# Patient Record
Sex: Female | Born: 1975 | Race: White | Hispanic: Yes | Marital: Single | Smoking: Never smoker
Health system: Southern US, Community
[De-identification: ages and names within clinical notes are randomized; demographics above are authoritative.]

## PROBLEM LIST (undated history)

## (undated) DIAGNOSIS — B159 Hepatitis A without hepatic coma: Secondary | ICD-10-CM

## (undated) HISTORY — DX: Hepatitis a without hepatic coma: B15.9

---
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
## 2005-07-12 ENCOUNTER — Inpatient Hospital Stay (HOSPITAL_COMMUNITY): Admission: AD | Admit: 2005-07-12 | Discharge: 2005-07-12 | Payer: Self-pay | Admitting: *Deleted

## 2005-11-27 ENCOUNTER — Inpatient Hospital Stay (HOSPITAL_COMMUNITY): Admission: AD | Admit: 2005-11-27 | Discharge: 2005-11-27 | Payer: Self-pay | Admitting: Obstetrics

## 2005-12-08 ENCOUNTER — Inpatient Hospital Stay (HOSPITAL_COMMUNITY): Admission: RE | Admit: 2005-12-08 | Discharge: 2005-12-11 | Payer: Self-pay | Admitting: Obstetrics

## 2007-04-01 ENCOUNTER — Emergency Department (HOSPITAL_COMMUNITY): Admission: EM | Admit: 2007-04-01 | Discharge: 2007-04-01 | Payer: Self-pay | Admitting: Emergency Medicine

## 2007-07-03 ENCOUNTER — Encounter: Payer: Self-pay | Admitting: Family Medicine
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
## 2009-02-17 ENCOUNTER — Ambulatory Visit: Payer: Self-pay | Admitting: Family Medicine

## 2009-02-17 DIAGNOSIS — N946 Dysmenorrhea, unspecified: Secondary | ICD-10-CM

## 2009-02-17 DIAGNOSIS — N649 Disorder of breast, unspecified: Secondary | ICD-10-CM | POA: Insufficient documentation

## 2009-02-17 DIAGNOSIS — K219 Gastro-esophageal reflux disease without esophagitis: Secondary | ICD-10-CM

## 2009-03-31 ENCOUNTER — Ambulatory Visit: Payer: Self-pay | Admitting: Family Medicine

## 2009-03-31 DIAGNOSIS — R300 Dysuria: Secondary | ICD-10-CM

## 2009-03-31 LAB — CONVERTED CEMR LAB
Bilirubin Urine: NEGATIVE
Protein, U semiquant: NEGATIVE
Specific Gravity, Urine: >=1.03
Urobilinogen, UA: 0.2
pH: 5.5

## 2009-04-01 ENCOUNTER — Encounter: Payer: Self-pay | Admitting: Family Medicine

## 2009-04-04 ENCOUNTER — Encounter: Payer: Self-pay | Admitting: Family Medicine

## 2010-05-31 ENCOUNTER — Encounter: Payer: Self-pay | Admitting: *Deleted

## 2010-09-11 NOTE — Discharge Summary (Signed)
NAME:  Linda Murphy, CLENDENEN NO.:  [id number]   MEDICAL RECORD NO.:  [id number]          PATIENT TYPE:  INP   LOCATION:  9133                          FACILITY:  [facility]   PHYSICIAN:  Kathreen Cosier, M.D.DATE OF BIRTH:  11/14/1975   DATE OF ADMISSION:  12/08/2005  DATE OF DISCHARGE:  12/11/2005                                 DISCHARGE SUMMARY   The patient is a 35 year old gravida 2, para 1-0-0-1 who had a previous  cesarean section and is now at term, desires repeat cesarean section, and on  August 15 had a female, Apgars 8 and 9, 6 pounds 15 ounces. Postoperatively,  she did well. Her hemoglobin was 11.5. RPR negative. Urine negative. She was  discharged home on the third postoperative day ambulatory, on a regular  diet, to see me in six weeks.   DISCHARGE DIAGNOSIS:  Status post elective repeat cesarean section a term.           ______________________________  Kathreen Cosier, M.D.     BAM/MEDQ  D:  01/05/2006  T:  01/05/2006  Job:  [id number]

## 2010-09-11 NOTE — Op Note (Signed)
NAME:  Linda Murphy, Linda Murphy NO.:  [id number]   MEDICAL RECORD NO.:  [id number]          PATIENT TYPE:  INP   LOCATION:  9133                          FACILITY:  [facility]   PHYSICIAN:  Kathreen Cosier, M.D.DATE OF BIRTH:  Sep 19, 1975   DATE OF PROCEDURE:  12/08/2005  DATE OF DISCHARGE:                                 OPERATIVE REPORT   PREOPERATIVE DIAGNOSIS:  Previous cesarean section at term, desires repeat.   POSTOPERATIVE DIAGNOSIS:  Previous cesarean section at term, desires repeat.   OPERATION PERFORMED:   SURGEON:  Kathreen Cosier, M.D.   ASSISTANT:   ANESTHESIA:  Spinal.   DESCRIPTION OF PROCEDURE:  Patient placed on the operating table in supine  position.  After the spinal was administered, the abdomen prepped and  draped, bladder emptied with Foley catheter.  Transverse suprapubic incision  was made through the old scar, carried down to the rectus fascia, fascia  cleanly incised the length of the incision.  Rectus muscles were retracted  laterally.  Peritoneum incised longitudinally.  Transverse incision made in  the visceral peritoneum above the bladder.  Bladder mobilized inferiorly.  Transverse lower uterine incision made.  The patient was delivered from the  LOA position of a female, Apgar 8 and 9, weighing 6 pounds and 15 ounces.  The fluid was clear.  Team was in attendance.  Placenta was posterior and  removed manually, sent to labor and delivery.  Uterine cavity cleaned with  dry laps.  Uterine incision closed  in one layer with continuous suture of  #1 chromic.  Hemostasis was satisfactory.  Bladder flap reattached with 2-0  chromic.  Lap and sponge counts correct.  Abdomen closed in layers.  Peritoneum continuous 2-0 chromic.  Fascia continuous suture of 0 Dexon and  the skin closed with subcuticular stitch of 4-0 Monocryl.   ESTIMATED BLOOD LOSS:  600 mL.           ______________________________  Kathreen Cosier, M.D.     BAM/MEDQ  D:  12/08/2005  T:  12/08/2005  Job:  [id number]

## 2011-01-04 ENCOUNTER — Other Ambulatory Visit: Payer: Self-pay | Admitting: Geriatric Medicine

## 2011-01-04 DIAGNOSIS — N83201 Unspecified ovarian cyst, right side: Secondary | ICD-10-CM

## 2011-01-07 ENCOUNTER — Ambulatory Visit
Admission: RE | Admit: 2011-01-07 | Discharge: 2011-01-07 | Disposition: A | Discharge status: Home / Self Care | Payer: Self-pay | Source: Ambulatory Visit | Attending: Geriatric Medicine | Admitting: Geriatric Medicine

## 2011-01-07 DIAGNOSIS — N83201 Unspecified ovarian cyst, right side: Secondary | ICD-10-CM

## 2011-04-22 ENCOUNTER — Encounter: Payer: Self-pay | Admitting: Family Medicine

## 2012-03-03 IMAGING — US US PELVIS COMPLETE
1 series · 14 of 25 positions shown · non-contrast
Comparison: None.

CLINICAL DATA: Lower pelvic pain, ovarian cyst on the right

TRANSABDOMINAL AND TRANSVAGINAL ULTRASOUND OF PELVIS
TECHNIQUE: Both transabdominal and transvaginal ultrasound
examinations of the pelvis were performed. Transabdominal technique
was performed for global imaging of the pelvis including uterus,
ovaries, adnexal regions, and pelvic cul-de-sac.

[Series 1: us pelvis complete · 0.26mm/px · 14 of 57 slices shown]
[im 1/57]
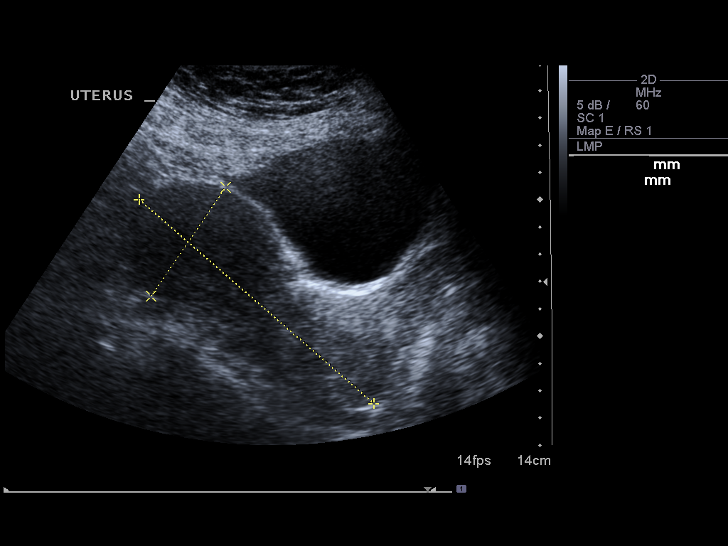
[im 5/57]
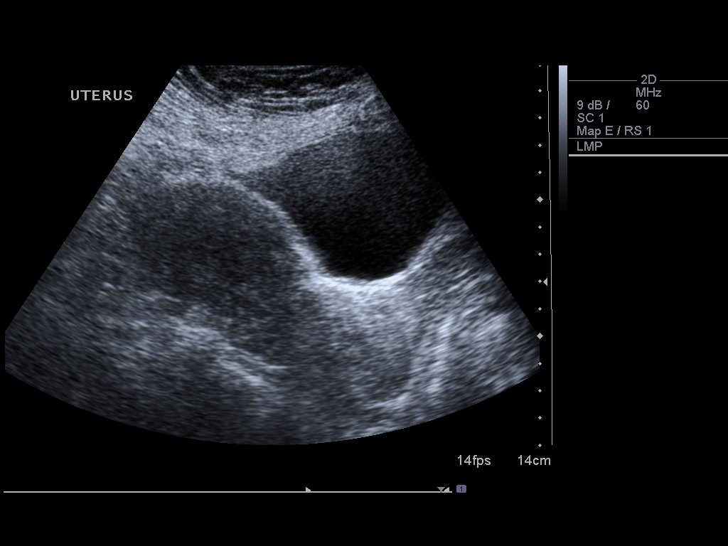
[im 10/57]
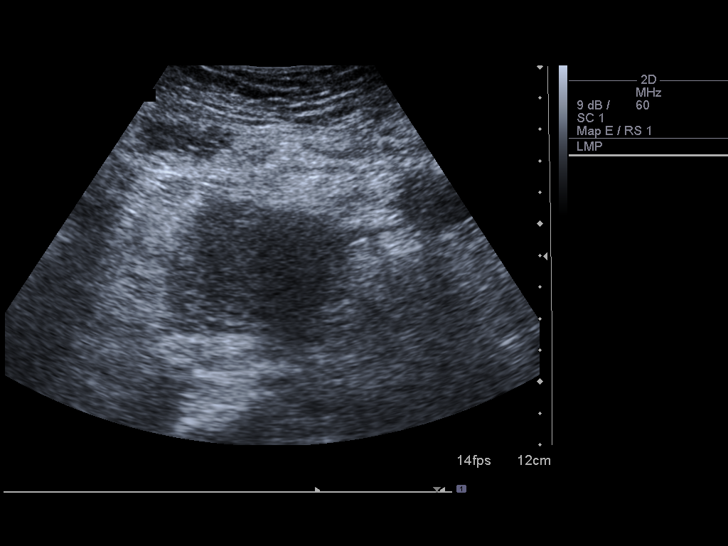
[im 15/57]
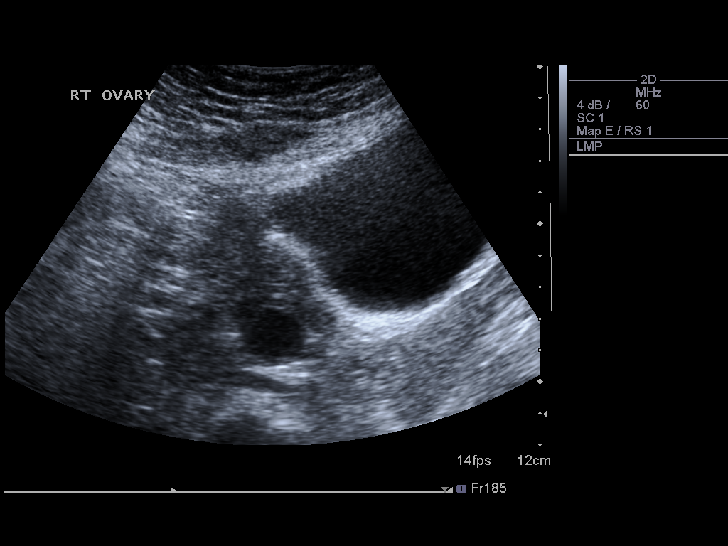
[im 19/57]
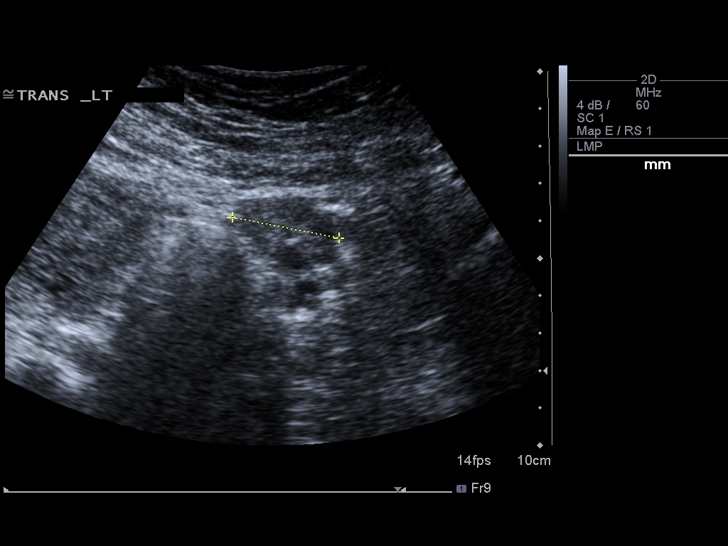
[im 22/57]
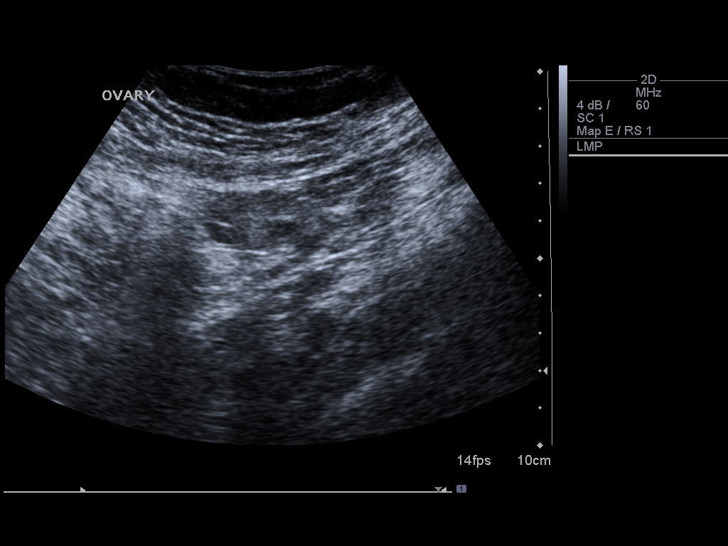
[im 26/57]
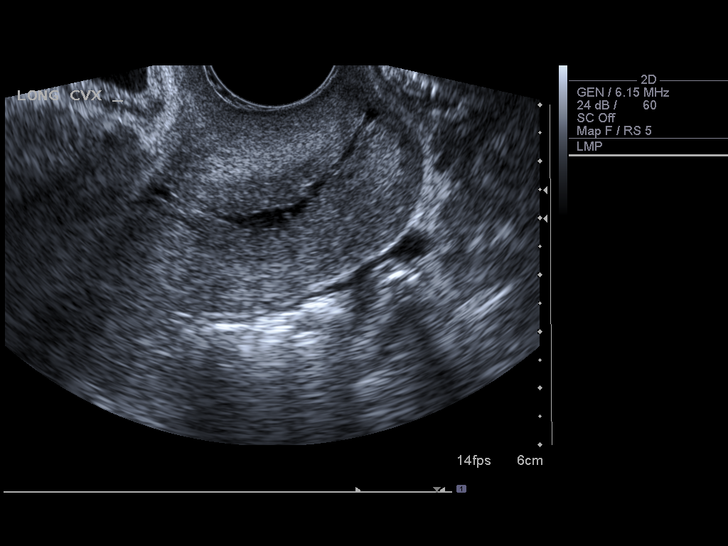
[im 31/57]
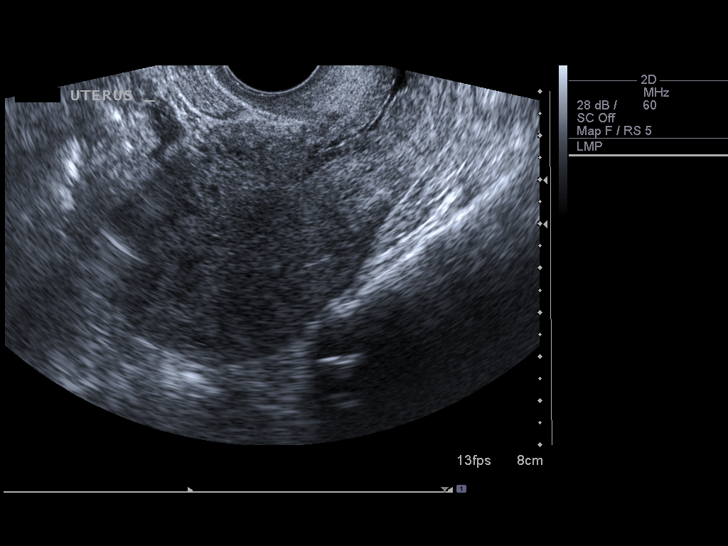
[im 36/57]
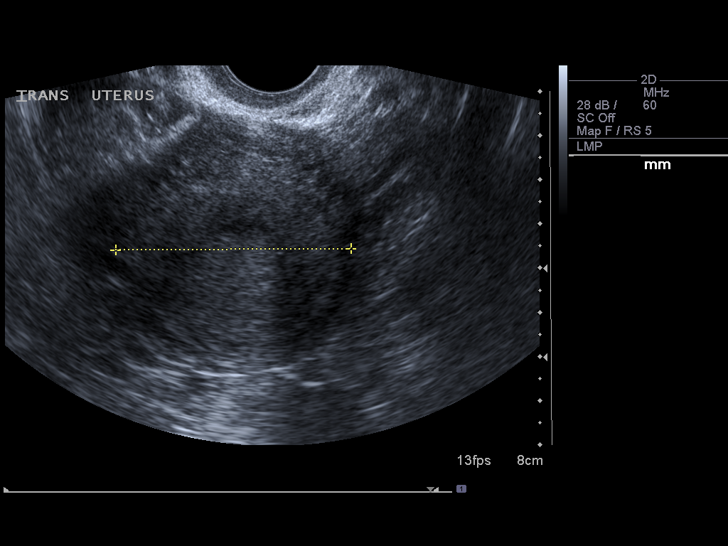
[im 38/57]
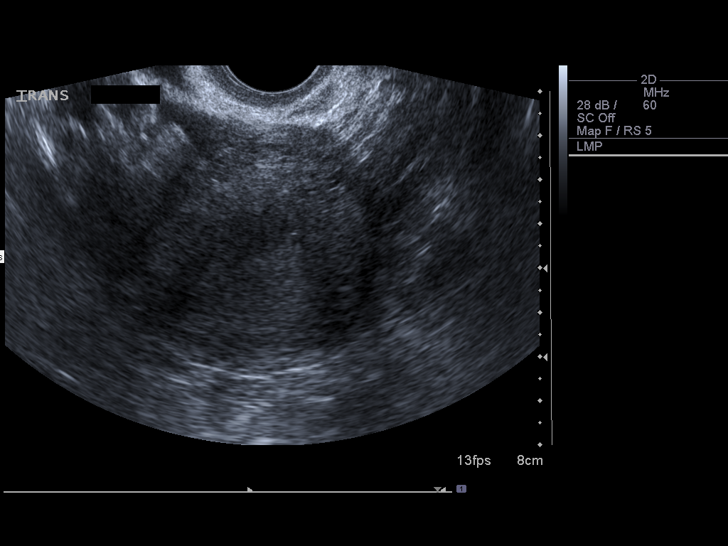
[im 43/57]
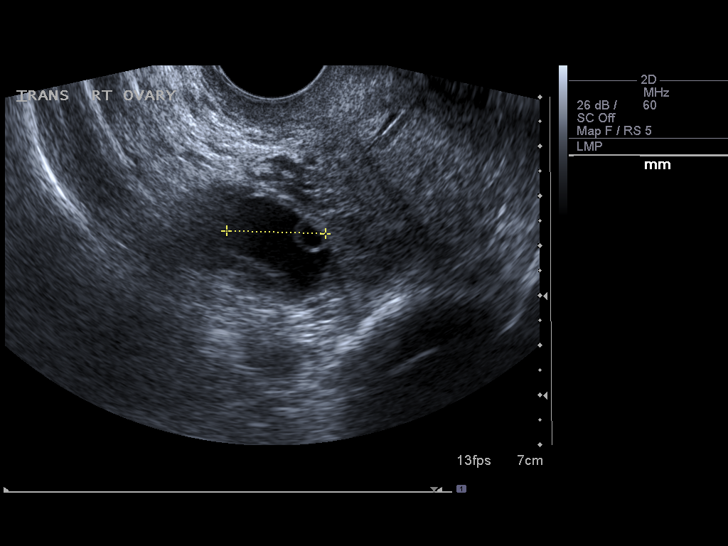
[im 47/57]
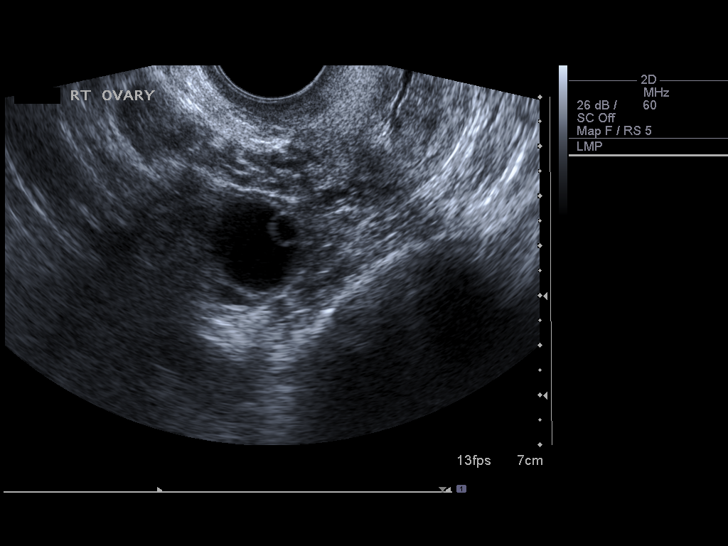
[im 52/57]
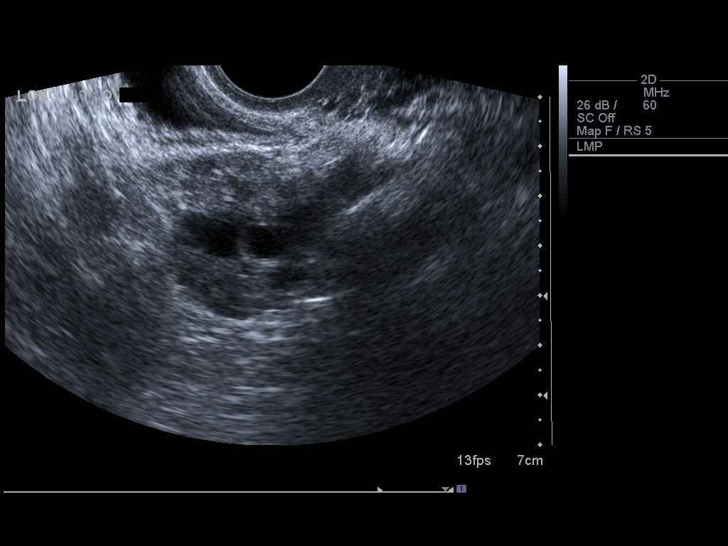
[im 57/57]
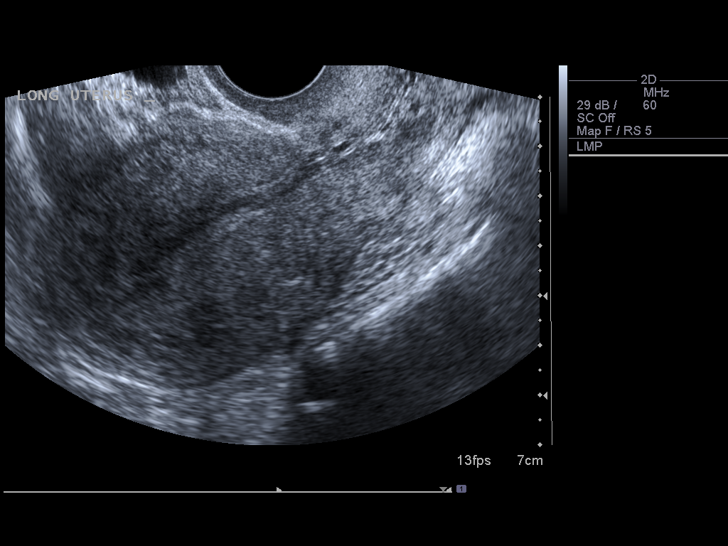

[14 of 25 positions shown; findings below may reference images not displayed]

It was necessary to proceed with endovaginal exam following the
transabdominal exam to visualize the adnexa.
FINDINGS: The uterus is normal in size and echotexture, measuring
11.4 x 4.8 x 5.6 cm.  No fibroids are identified.  The endometrial
stripe is homogeneous and within normal limits in width, measuring
13 mm.

Both ovaries have a normal size and appearance.  The right ovary
measures 3.1 x 2.4 x 3.8 cm and contains a 2 cm simple follicle
with a cumulus oophorus.  The left ovary is also normal in size and
appearance, measuring 2.7 x 2.3 x 2.3 cm.  There are no adnexal
masses or free pelvic fluid.
IMPRESSION: Normal pelvic ultrasound.

## 2013-05-28 ENCOUNTER — Other Ambulatory Visit (HOSPITAL_COMMUNITY): Payer: Self-pay | Admitting: *Deleted

## 2013-05-28 DIAGNOSIS — N6320 Unspecified lump in the left breast, unspecified quadrant: Secondary | ICD-10-CM

## 2013-05-29 ENCOUNTER — Ambulatory Visit (HOSPITAL_COMMUNITY)
Admission: RE | Admit: 2013-05-29 | Discharge: 2013-05-29 | Disposition: A | Discharge status: Home / Self Care | Payer: Self-pay | Source: Ambulatory Visit | Attending: Obstetrics and Gynecology | Admitting: Obstetrics and Gynecology

## 2013-05-29 ENCOUNTER — Encounter (INDEPENDENT_AMBULATORY_CARE_PROVIDER_SITE_OTHER): Payer: Self-pay

## 2013-05-29 ENCOUNTER — Encounter (HOSPITAL_COMMUNITY): Payer: Self-pay

## 2013-05-29 VITALS — BP 98/62 | Temp 98.3°F | Ht 60.5 in | Wt 143.8 lb

## 2013-05-29 DIAGNOSIS — Z01419 Encounter for gynecological examination (general) (routine) without abnormal findings: Secondary | ICD-10-CM

## 2013-05-29 DIAGNOSIS — N644 Mastodynia: Secondary | ICD-10-CM | POA: Insufficient documentation

## 2013-05-29 NOTE — Patient Instructions (Signed)
Taught Linda Murphy how to perform BSE and gave educational materials to take home. Let her know [facility] will cover Pap smears every 3 years unless has a history of abnormal Pap smears. Referred patient to the [place] for diagnostic mammogram. Appointment scheduled for Friday, June 01, 2013 at 1345. Patient aware of appointment and will be there. Let patient know will follow up with her within the next couple weeks with results of Pap smear by phone. [name] verbalized understanding.  [name], Arvil Chaco, RN 12:13 PM

## 2013-05-29 NOTE — Addendum Note (Signed)
Encounter addended by: Shirley Muscat, RN on: 05/29/2013 12:31 PM<BR>     Documentation filed: Notes Section

## 2013-05-29 NOTE — Progress Notes (Addendum)
Complaints of left breast lump x 3 months that comes and goes. Patient complained of left breast pain that is sharp at times. Patient rated pain at a 7 out of 10.  Pap Smear:    Pap smear completed today. Patients last Pap smear was in 2012 at Dr. Milinda Cave office and normal per patient. Per patient has no history of an abnormal Pap smear. Pap smear on 04/01/2009 is in EPIC.  Physical exam: Breasts Breasts symmetrical. No skin abnormalities bilateral breasts. No nipple retraction bilateral breasts. No nipple discharge bilateral breasts. No lymphadenopathy. No lumps palpated bilateral breasts. No lumps palpated in patients area of concern within her left breast. Patient did complain of left outer breast tenderness on exam. Referred patient to the [place] for diagnostic mammogram. Appointment scheduled for Friday, June 01, 2013 at 1345.           Pelvic/Bimanual   Ext Genitalia No lesions, no swelling and no discharge observed on external genitalia.         Vagina Vagina pink and normal texture. No lesions or discharge observed in vagina.          Cervix Cervix is present. Cervix pink and of normal texture. Cervix friable. No discharge observed.     Uterus Uterus is present and palpable. Uterus in normal position and normal size.        Adnexae Bilateral ovaries present and palpable. No tenderness on palpation.          Rectovaginal No rectal exam completed today since patient had no rectal complaints. No skin abnormalities observed on exam.

## 2013-06-01 ENCOUNTER — Other Ambulatory Visit (HOSPITAL_COMMUNITY): Payer: Self-pay | Admitting: Obstetrics and Gynecology

## 2013-06-01 ENCOUNTER — Ambulatory Visit
Admission: RE | Admit: 2013-06-01 | Discharge: 2013-06-01 | Disposition: A | Discharge status: Home / Self Care | Payer: No Typology Code available for payment source | Source: Ambulatory Visit | Attending: Obstetrics and Gynecology | Admitting: Obstetrics and Gynecology

## 2013-06-01 DIAGNOSIS — N6320 Unspecified lump in the left breast, unspecified quadrant: Secondary | ICD-10-CM

## 2013-06-28 ENCOUNTER — Telehealth (HOSPITAL_COMMUNITY): Payer: Self-pay | Admitting: *Deleted

## 2013-06-28 NOTE — Telephone Encounter (Signed)
Telephoned patient with interpreter Lavon Paganini. Advised patient pap smear was normal. Next pap smear due in 3 years. Patient voiced understanding.

## 2014-02-25 ENCOUNTER — Encounter (HOSPITAL_COMMUNITY): Payer: Self-pay

## 2015-06-30 ENCOUNTER — Other Ambulatory Visit (HOSPITAL_COMMUNITY): Payer: Self-pay | Admitting: *Deleted

## 2015-07-02 ENCOUNTER — Encounter (HOSPITAL_COMMUNITY): Payer: Self-pay | Admitting: *Deleted

## 2015-07-03 ENCOUNTER — Encounter (HOSPITAL_COMMUNITY): Payer: Self-pay

## 2015-07-03 ENCOUNTER — Ambulatory Visit (HOSPITAL_COMMUNITY)
Admission: RE | Admit: 2015-07-03 | Discharge: 2015-07-03 | Disposition: A | Discharge status: Home / Self Care | Payer: Self-pay | Source: Ambulatory Visit | Attending: Obstetrics and Gynecology | Admitting: Obstetrics and Gynecology

## 2015-07-03 ENCOUNTER — Other Ambulatory Visit (HOSPITAL_COMMUNITY): Payer: Self-pay | Admitting: *Deleted

## 2015-07-03 VITALS — BP 110/68 | Temp 98.3°F | Ht 60.0 in | Wt 138.0 lb

## 2015-07-03 DIAGNOSIS — D242 Benign neoplasm of left breast: Principal | ICD-10-CM

## 2015-07-03 DIAGNOSIS — R87810 Cervical high risk human papillomavirus (HPV) DNA test positive: Secondary | ICD-10-CM

## 2015-07-03 DIAGNOSIS — D241 Benign neoplasm of right breast: Secondary | ICD-10-CM

## 2015-07-03 DIAGNOSIS — Z1239 Encounter for other screening for malignant neoplasm of breast: Secondary | ICD-10-CM

## 2015-07-03 DIAGNOSIS — R87610 Atypical squamous cells of undetermined significance on cytologic smear of cervix (ASC-US): Secondary | ICD-10-CM

## 2015-07-03 NOTE — Progress Notes (Signed)
Patient referred to [facility] due to having an abnormal Pap smear on 06/09/2015 that a colposcopy is recommended for follow up. Patient had a bilateral diagnostic mammogram completed 06/01/2013 that six month follow up was recommended and patient has not followed up.  Pap Smear:  Pap smear not completed today. Last Pap smear was 06/09/2015 at the free cervical cancer screening at the [facility] and ASCUS HPV+. Referred patient to the [place] for a colposcopy to follow up for abnormal Pap smear. Appointment scheduled for Wednesday, August 06, 2015 at 1315. Per patient has no history of an abnormal Pap smear prior to the most recent Pap smear. Last Pap smear result is in EPIC.  Physical exam: Breasts Breasts symmetrical. No skin abnormalities bilateral breasts. No nipple retraction bilateral breasts. No nipple discharge bilateral breasts. No lymphadenopathy. No lumps palpated bilateral breasts. No complaints of pain or tenderness on exam. Referred patient to the [place] for bilateral diagnostic mammogram per recommendation. Appointment scheduled for Wednesday, July 09, 2015 at 1340.       Pelvic/Bimanual No Pap smear completed today since last Pap smear was 06/09/2015. Pap smear not indicated per BCCCP guidelines.   Smoking History: Patient has never smoked.  Patient Navigation: Patient education provided. Access to services provided for patient through [facility] program. Spanish interpreter provided.  Used Spanish interpreter [employer].

## 2015-07-03 NOTE — Patient Instructions (Addendum)
Educational materials on breast self awareness given. Explained the colposcopy to [facility] the needed follow up for her abnormal Pap smear she had completed 06/09/2015. Referred patient to the [place] for a colposcopy to follow up for abnormal Pap smear. Appointment scheduled for Wednesday, August 06, 2015 at 1315. Referred patient to the [place] for bilateral diagnostic mammogram per recommendation. Appointment scheduled for Wednesday, July 09, 2015 at 1340. Patient aware of appointments and will be there. Linda Murphy verbalized understanding.  Dakota Vanwart, Arvil Chaco, RN 2:10 PM

## 2015-07-04 ENCOUNTER — Encounter (HOSPITAL_COMMUNITY): Payer: Self-pay | Admitting: *Deleted

## 2015-07-09 ENCOUNTER — Ambulatory Visit
Admission: RE | Admit: 2015-07-09 | Discharge: 2015-07-09 | Disposition: A | Discharge status: Home / Self Care | Payer: No Typology Code available for payment source | Source: Ambulatory Visit | Attending: Obstetrics and Gynecology | Admitting: Obstetrics and Gynecology

## 2015-07-09 DIAGNOSIS — D242 Benign neoplasm of left breast: Principal | ICD-10-CM

## 2015-07-09 DIAGNOSIS — D241 Benign neoplasm of right breast: Secondary | ICD-10-CM

## 2015-08-06 ENCOUNTER — Encounter: Payer: Self-pay | Admitting: Obstetrics & Gynecology

## 2015-08-06 ENCOUNTER — Other Ambulatory Visit (HOSPITAL_COMMUNITY)
Admission: RE | Admit: 2015-08-06 | Discharge: 2015-08-06 | Disposition: A | Discharge status: Home / Self Care | Payer: No Typology Code available for payment source | Source: Ambulatory Visit | Attending: Obstetrics & Gynecology | Admitting: Obstetrics & Gynecology

## 2015-08-06 ENCOUNTER — Ambulatory Visit (INDEPENDENT_AMBULATORY_CARE_PROVIDER_SITE_OTHER): Payer: Self-pay | Admitting: Obstetrics & Gynecology

## 2015-08-06 VITALS — BP 102/81 | HR 82 | Wt 137.6 lb

## 2015-08-06 DIAGNOSIS — Z3202 Encounter for pregnancy test, result negative: Secondary | ICD-10-CM

## 2015-08-06 DIAGNOSIS — R896 Abnormal cytological findings in specimens from other organs, systems and tissues: Secondary | ICD-10-CM

## 2015-08-06 DIAGNOSIS — IMO0002 Reserved for concepts with insufficient information to code with codable children: Secondary | ICD-10-CM

## 2015-08-06 LAB — POCT PREGNANCY, URINE: PREG TEST UR: NEGATIVE

## 2015-08-06 NOTE — Progress Notes (Signed)
   Subjective:    Patient ID: Linda Murphy, female    DOB: Jun 18, 1975, 40 y.o.   MRN: [id number]  HPI 40 yo H female here for a colpo due to ASCUS + HR HPV pap.   Review of Systems     Objective:   Physical Exam UPT negative, consent signed, time out done Cervix prepped with acetic acid. Transformation zone seen in its entirety. Colpo adequate. Entirely normal colpo ECC obtained. She tolerated the procedure well.     Assessment & Plan:  ASCUS with + HR HPV pap- await ECC  If ECC negative, then pap with cotesting in a year

## 2015-08-12 ENCOUNTER — Telehealth: Payer: Self-pay

## 2015-08-12 NOTE — Telephone Encounter (Signed)
Spoke with patient she is aware of her lab results and will return to clinic in one year.

## 2016-10-04 ENCOUNTER — Other Ambulatory Visit: Payer: Self-pay

## 2016-10-13 LAB — CYTOLOGY - PAP
Diagnosis: UNDETERMINED
HPV: DETECTED

## 2016-11-25 ENCOUNTER — Encounter (HOSPITAL_COMMUNITY): Payer: Self-pay

## 2016-11-25 ENCOUNTER — Ambulatory Visit (HOSPITAL_COMMUNITY)
Admission: RE | Admit: 2016-11-25 | Discharge: 2016-11-25 | Disposition: A | Discharge status: Home / Self Care | Payer: Self-pay | Source: Ambulatory Visit | Attending: Obstetrics and Gynecology | Admitting: Obstetrics and Gynecology

## 2016-11-25 VITALS — BP 127/72 | HR 71 | Temp 98.6°F | Ht 59.5 in | Wt 140.4 lb

## 2016-11-25 DIAGNOSIS — R87810 Cervical high risk human papillomavirus (HPV) DNA test positive: Secondary | ICD-10-CM

## 2016-11-25 DIAGNOSIS — R87610 Atypical squamous cells of undetermined significance on cytologic smear of cervix (ASC-US): Secondary | ICD-10-CM

## 2016-11-25 DIAGNOSIS — Z1239 Encounter for other screening for malignant neoplasm of breast: Secondary | ICD-10-CM

## 2016-11-25 NOTE — Patient Instructions (Signed)
Explained breast self awareness with Linda Murphy. Patient did not need a Pap smear today due to last Pap smear was 10/04/2016. Explained the colposcopy the recommended follow up for her abnormal Pap smear. Patient referred to the [facility] [place] [facility] for a colpscopy. Appointment scheduled for Friday, December 17, 2016 at 0900. Patient aware of appointment and will be there. Let patient know she will need a screening mammogram at age 24, which is in April of 2019. Informed patient that it is covered by [facility] and to call us to schedule her appointment. Linda Murphy verbalized understanding.  Florence Yeung, Arvil Chaco, RN 4:44 PM

## 2016-11-25 NOTE — Progress Notes (Signed)
Patient referred to [facility] due to having an abnormal Pap smear 10/04/2016 that a colposcopy is recommended for follow up.  /Pap Smear: Pap smear not completed today. Last Pap smear was 10/04/2016 at the free cervical cancer screening at the [facility] and ASCUS with positive HPV. Patient referred to the [facility] [place] [facility] for a colpscopy. Appointment scheduled for Friday, December 17, 2016 at 0900. Patient has a history of one other abnormal Pap smear 06/09/2015 that was ASCUS with positive HPV. Patient had a colposcopy to follow up 08/06/2015 that was benign. Last two Pap smear results and colposcopy result are in EPIC.  Physical exam: Breasts Breasts symmetrical. No skin abnormalities bilateral breasts. No nipple retraction bilateral breasts. No nipple discharge bilateral breasts. No lymphadenopathy. No lumps palpated bilateral breasts. No complaints of pain or tenderness on exam. Screening mammogram recommended at age 43 unless clinically indicated.        Pelvic/Bimanual No Pap smear completed today since last Pap smear was 10/04/2016. Pap smear not indicated per BCCCP guidelines.   Smoking History: Patient has never smoked.  Patient Navigation: Patient education provided. Access to services provided for patient through [facility] program. Spanish interpreter provided.  Used Spanish interpreter [employer] from [employer].

## 2016-12-17 ENCOUNTER — Other Ambulatory Visit (HOSPITAL_COMMUNITY)
Admission: RE | Admit: 2016-12-17 | Discharge: 2016-12-17 | Disposition: A | Discharge status: Home / Self Care | Payer: No Typology Code available for payment source | Source: Ambulatory Visit | Attending: Obstetrics & Gynecology | Admitting: Obstetrics & Gynecology

## 2016-12-17 ENCOUNTER — Ambulatory Visit (INDEPENDENT_AMBULATORY_CARE_PROVIDER_SITE_OTHER): Payer: Self-pay | Admitting: Obstetrics & Gynecology

## 2016-12-17 ENCOUNTER — Encounter: Payer: Self-pay | Admitting: Obstetrics & Gynecology

## 2016-12-17 VITALS — BP 115/67 | HR 62 | Wt 138.7 lb

## 2016-12-17 DIAGNOSIS — R87810 Cervical high risk human papillomavirus (HPV) DNA test positive: Secondary | ICD-10-CM

## 2016-12-17 DIAGNOSIS — R87610 Atypical squamous cells of undetermined significance on cytologic smear of cervix (ASC-US): Secondary | ICD-10-CM

## 2016-12-17 NOTE — Progress Notes (Addendum)
   Subjective:    Patient ID: Linda Murphy, female    DOB: 07-Dec-1975, 41 y.o.   MRN: [id number]  HPI 41 yo H lady here for a colpo due to a ASCUS + HR HPV pap at [facility] recently. She had the same thing last year and had a negative colpo and ECC in 2017.   Review of Systems     Objective:   Physical Exam   UPT negative, consent signed, time out done Cervix prepped with acetic acid. Transformation zone seen in its entirety. Colpo adequate. Entirely normal colpo ECC obtained. She tolerated the procedure well.     Assessment & Plan:  ASCUS + HR HPV pap- normal colpo Await ECC results

## 2016-12-29 ENCOUNTER — Telehealth: Payer: Self-pay | Admitting: General Practice

## 2016-12-29 NOTE — Telephone Encounter (Signed)
-----   Message from Emily Filbert, MD sent at 12/23/2016  5:01 PM EDT ----- She will need a pap with cotesting in a year. Thanks

## 2016-12-29 NOTE — Telephone Encounter (Signed)
Called patient with [employer] interpreter [phone], no answer- left message to call us back for results

## 2017-01-11 NOTE — Telephone Encounter (Signed)
Called patient with [facility] interpreter Clarise Cruz [phone]. Left message for patient to call regarding non urgent results.

## 2017-01-24 ENCOUNTER — Encounter: Payer: Self-pay | Admitting: *Deleted

## 2017-01-24 NOTE — Telephone Encounter (Signed)
Letter sent.

## 2017-04-06 ENCOUNTER — Encounter (HOSPITAL_COMMUNITY): Payer: Self-pay

## 2017-06-20 ENCOUNTER — Other Ambulatory Visit: Payer: Self-pay

## 2017-06-23 LAB — CYTOLOGY - PAP: DIAGNOSIS: NEGATIVE

## 2019-12-26 ENCOUNTER — Other Ambulatory Visit: Payer: No Typology Code available for payment source

## 2019-12-26 ENCOUNTER — Other Ambulatory Visit: Payer: Self-pay

## 2019-12-26 DIAGNOSIS — Z20822 Contact with and (suspected) exposure to covid-19: Secondary | ICD-10-CM

## 2019-12-28 LAB — NOVEL CORONAVIRUS, NAA: SARS-CoV-2, NAA: NOT DETECTED
